# Patient Record
Sex: Male | Born: 1965 | Race: White | Hispanic: No | Marital: Single | Smoking: Never smoker
Health system: Southern US, Community
[De-identification: ages and names within clinical notes are randomized; demographics above are authoritative.]

## PROBLEM LIST (undated history)

## (undated) DIAGNOSIS — R1013 Epigastric pain: Secondary | ICD-10-CM

## (undated) DIAGNOSIS — R0789 Other chest pain: Secondary | ICD-10-CM

## (undated) DIAGNOSIS — M609 Myositis, unspecified: Secondary | ICD-10-CM

## (undated) DIAGNOSIS — N451 Epididymitis: Secondary | ICD-10-CM

## (undated) DIAGNOSIS — M791 Myalgia, unspecified site: Secondary | ICD-10-CM

## (undated) HISTORY — DX: Other chest pain: R07.89

## (undated) HISTORY — DX: Myositis, unspecified: M60.9

## (undated) HISTORY — DX: Myalgia, unspecified site: M79.10

## (undated) HISTORY — DX: Epigastric pain: R10.13

## (undated) HISTORY — PX: LASIK: SHX215

## (undated) HISTORY — DX: Epididymitis: N45.1

## (undated) HISTORY — PX: OTHER SURGICAL HISTORY: SHX169

---

## 2000-03-08 ENCOUNTER — Encounter: Admission: RE | Admit: 2000-03-08 | Discharge: 2000-03-08 | Payer: Self-pay | Admitting: Family Medicine

## 2000-03-08 ENCOUNTER — Encounter: Payer: Self-pay | Admitting: Family Medicine

## 2004-07-27 ENCOUNTER — Encounter: Admission: RE | Admit: 2004-07-27 | Discharge: 2004-07-27 | Payer: Self-pay | Admitting: Family Medicine

## 2004-11-03 ENCOUNTER — Encounter: Admission: RE | Admit: 2004-11-03 | Discharge: 2004-11-03 | Payer: Self-pay | Admitting: Family Medicine

## 2006-07-06 ENCOUNTER — Encounter: Admission: RE | Admit: 2006-07-06 | Discharge: 2006-07-06 | Payer: Self-pay | Admitting: *Deleted

## 2011-11-09 ENCOUNTER — Other Ambulatory Visit: Payer: Self-pay | Admitting: Family Medicine

## 2011-11-09 DIAGNOSIS — R2230 Localized swelling, mass and lump, unspecified upper limb: Secondary | ICD-10-CM

## 2011-11-09 DIAGNOSIS — R0789 Other chest pain: Secondary | ICD-10-CM

## 2011-11-14 ENCOUNTER — Ambulatory Visit
Admission: RE | Admit: 2011-11-14 | Discharge: 2011-11-14 | Disposition: A | Discharge status: Home / Self Care | Payer: 59 | Source: Ambulatory Visit | Attending: Family Medicine | Admitting: Family Medicine

## 2011-11-14 ENCOUNTER — Other Ambulatory Visit: Payer: Self-pay | Admitting: Family Medicine

## 2011-11-14 DIAGNOSIS — R0789 Other chest pain: Secondary | ICD-10-CM

## 2011-11-14 DIAGNOSIS — R2230 Localized swelling, mass and lump, unspecified upper limb: Secondary | ICD-10-CM

## 2012-08-21 HISTORY — PX: SHOULDER SURGERY: SHX246

## 2013-06-30 ENCOUNTER — Other Ambulatory Visit: Payer: Self-pay | Admitting: Orthopedic Surgery

## 2013-06-30 DIAGNOSIS — M25511 Pain in right shoulder: Secondary | ICD-10-CM

## 2013-07-10 ENCOUNTER — Ambulatory Visit
Admission: RE | Admit: 2013-07-10 | Discharge: 2013-07-10 | Disposition: A | Discharge status: Home / Self Care | Payer: 59 | Source: Ambulatory Visit | Attending: Orthopedic Surgery | Admitting: Orthopedic Surgery

## 2013-07-10 DIAGNOSIS — M25511 Pain in right shoulder: Secondary | ICD-10-CM

## 2013-07-10 MED ORDER — IOHEXOL 180 MG/ML  SOLN
15.0000 mL | Freq: Once | INTRAMUSCULAR | Status: AC | PRN
  Administered 2013-07-10: 15 mL via INTRA_ARTICULAR

## 2015-10-12 NOTE — Progress Notes (Signed)
Patient ID: Jesus Anderson, male   DOB: 13-Aug-1966, 50 y.o.   MRN: [id number]     Cardiology Office Note   Date:  10/18/2015   ID:  Jesus Anderson, DOB September 16, 1965, MRN [id number]  PCP:  Jerral Ralph, MD  Cardiologist:   Charlton Haws, MD   Chief Complaint  Patient presents with  . Establish Care    tightness & pressure in chest, been 6/7 weeks now.      History of Present Illness: Jesus Anderson is a 50 y.o. male who presents for evaluation of chest pain  Seen by [facility] 09/13/15  Records reviewed.  Pain is atypical.  Central chest Sharp and dull.  Not related to exertion. Family history of premature CAD on mothers side.  Pain is persistent the last 6 weeks.  Has not really tried prilosec for it Worried as he has 3 young children and does not want to have a heart attack    Past Medical History  Diagnosis Date  . Epididymitis   . Myalgia   . Myositis   . Dyspepsia   . Chest heaviness     History reviewed. No pertinent past surgical history.   No current outpatient prescriptions on file.   No current facility-administered medications for this visit.    Allergies:   Review of patient's allergies indicates no known allergies.    Social History:  The patient  reports that he has never smoked. He does not have any smokeless tobacco history on file.   Family History:  The patient's family history includes Coronary artery disease (age of onset: 73) in his maternal grandfather; Healthy in his daughter, daughter, father, sister, and son; Hypertension in his mother and sister.    ROS:  Please see the history of present illness.   Otherwise, review of systems are positive for none.   All other systems are reviewed and negative.    PHYSICAL EXAM: VS:  BP 140/32 mmHg  Pulse 83  Ht  (1.702 m)  Wt 80.196 kg (176 lb 12.8 oz)  BMI 27.68 kg/m2 , BMI Body mass index is 27.68 kg/(m^2). Affect appropriate Healthy:  appears stated age HEENT: normal Neck supple  with no adenopathy JVP normal no bruits no thyromegaly Lungs clear with no wheezing and good diaphragmatic motion Heart:  S1/S2 no murmur, no rub, gallop or click PMI normal Abdomen: benighn, BS positve, no tenderness, no AAA no bruit.  No HSM or HJR Distal pulses intact with no bruits No edema Neuro non-focal Skin warm and dry No muscular weakness    EKG:  09/13/15 SR rate 80 normal   10/18/15  NSR rate 83  Normal    Recent Labs: No results found for requested labs within last 365 days.    Lipid Panel No results found for: CHOL, TRIG, HDL, CHOLHDL, VLDL, LDLCALC, LDLDIRECT    Wt Readings from Last 3 Encounters:  10/18/15 80.196 kg (176 lb 12.8 oz)      Other studies Reviewed: Additional studies/ records that were reviewed today include: Primary notes/ ECG from [facility] see HPI.    ASSESSMENT AND PLAN:  1.  Chest Pain:  Atypical normal ECG x 2  F/u ETT Also f/u coronary calcium score for 5 year risk assessment   Current medicines are reviewed at length with the patient today.  The patient does not have concerns regarding medicines.  The following changes have been made:  no change  Labs/ tests ordered today include: ETT Calcium  Score   No orders of the defined types were placed in this encounter.     Disposition:   FU with PRN     Signed, Charlton Haws, MD  10/18/2015 4:26 PM    [facility] [address] [place], [place], [place]  [address] Phone: [phone]; Fax: [phone]

## 2015-10-14 DIAGNOSIS — N451 Epididymitis: Secondary | ICD-10-CM | POA: Insufficient documentation

## 2015-10-14 DIAGNOSIS — M609 Myositis, unspecified: Secondary | ICD-10-CM | POA: Insufficient documentation

## 2015-10-14 DIAGNOSIS — M791 Myalgia, unspecified site: Secondary | ICD-10-CM | POA: Insufficient documentation

## 2015-10-14 DIAGNOSIS — R1013 Epigastric pain: Secondary | ICD-10-CM | POA: Insufficient documentation

## 2015-10-18 ENCOUNTER — Encounter: Payer: Self-pay | Admitting: Cardiovascular Disease

## 2015-10-18 ENCOUNTER — Ambulatory Visit (INDEPENDENT_AMBULATORY_CARE_PROVIDER_SITE_OTHER): Payer: 59 | Admitting: Cardiovascular Disease

## 2015-10-18 VITALS — BP 140/32 | HR 83 | Ht 67.0 in | Wt 176.8 lb

## 2015-10-18 DIAGNOSIS — Z7189 Other specified counseling: Secondary | ICD-10-CM

## 2015-10-18 DIAGNOSIS — Z7689 Persons encountering health services in other specified circumstances: Secondary | ICD-10-CM

## 2015-10-18 DIAGNOSIS — R0789 Other chest pain: Secondary | ICD-10-CM | POA: Diagnosis not present

## 2015-10-18 NOTE — Patient Instructions (Addendum)
Medication Instructions:  Your physician recommends that you continue on your current medications as directed. Please refer to the Current Medication list given to you today.  Labwork: NONE  Testing/Procedures: Your physician has requested that you have an exercise tolerance test. For further information please visit [email or web address]. Please also follow instruction sheet, as given.  Cardiac CT calcium score scanning, (CAT scanning), is a noninvasive, special x-ray that produces cross-sectional images of the body using x-rays and a computer. CT scans help physicians diagnose and treat medical conditions. For some CT exams, a contrast material is used to enhance visibility in the area of the body being studied. CT scans provide greater clarity and reveal more details than regular x-ray exams.  Follow-Up: Your physician wants you to follow-up as needed.  If you need a refill on your cardiac medications before your next appointment, please call your pharmacy.   

## 2015-10-29 ENCOUNTER — Ambulatory Visit (INDEPENDENT_AMBULATORY_CARE_PROVIDER_SITE_OTHER)
Admission: RE | Admit: 2015-10-29 | Discharge: 2015-10-29 | Disposition: A | Discharge status: Home / Self Care | Payer: Self-pay | Source: Ambulatory Visit | Attending: Cardiovascular Disease | Admitting: Cardiovascular Disease

## 2015-10-29 ENCOUNTER — Ambulatory Visit (INDEPENDENT_AMBULATORY_CARE_PROVIDER_SITE_OTHER): Payer: 59

## 2015-10-29 DIAGNOSIS — Z7189 Other specified counseling: Secondary | ICD-10-CM

## 2015-10-29 DIAGNOSIS — R0789 Other chest pain: Secondary | ICD-10-CM

## 2015-10-29 DIAGNOSIS — Z7689 Persons encountering health services in other specified circumstances: Secondary | ICD-10-CM

## 2015-10-29 LAB — EXERCISE TOLERANCE TEST
CHL CUP STRESS STAGE 1 DBP: 95 mmHg
CHL CUP STRESS STAGE 1 GRADE: 0 %
CHL CUP STRESS STAGE 10 DBP: 70 mmHg
CHL CUP STRESS STAGE 10 HR: 113 {beats}/min
CHL CUP STRESS STAGE 10 SBP: 156 mmHg
CHL CUP STRESS STAGE 2 SPEED: 1 mph
CHL CUP STRESS STAGE 3 GRADE: 0 %
CHL CUP STRESS STAGE 3 SPEED: 1 mph
CHL CUP STRESS STAGE 4 GRADE: 10 %
CHL CUP STRESS STAGE 4 HR: 115 {beats}/min
CHL CUP STRESS STAGE 4 SBP: 165 mmHg
CHL CUP STRESS STAGE 5 DBP: 81 mmHg
CHL CUP STRESS STAGE 5 GRADE: 12 %
CHL CUP STRESS STAGE 5 SBP: 180 mmHg
CHL CUP STRESS STAGE 5 SPEED: 2.5 mph
CHL CUP STRESS STAGE 6 HR: 141 {beats}/min
CHL CUP STRESS STAGE 6 SBP: 172 mmHg
CHL CUP STRESS STAGE 7 DBP: 78 mmHg
CHL CUP STRESS STAGE 8 HR: 171 {beats}/min
CHL CUP STRESS STAGE 8 SPEED: 4.2 mph
CHL CUP STRESS STAGE 9 GRADE: 0 %
CHL CUP STRESS STAGE 9 HR: 153 {beats}/min
CHL CUP STRESS STAGE 9 SBP: 161 mmHg
CHL CUP STRESS STAGE 9 SPEED: 1.5 mph
CHL RATE OF PERCEIVED EXERTION: 15
CSEPED: 12 min
CSEPEDS: 0 s
CSEPHR: 102 %
Estimated workload: 13.4 METS
MPHR: 168 {beats}/min
Peak HR: 171 {beats}/min
Percent of predicted max HR: 101 %
Rest HR: 85 {beats}/min
Stage 1 HR: 91 {beats}/min
Stage 1 SBP: 141 mmHg
Stage 1 Speed: 0 mph
Stage 10 Grade: 0 %
Stage 10 Speed: 0 mph
Stage 2 Grade: 0 %
Stage 2 HR: 93 {beats}/min
Stage 3 HR: 93 {beats}/min
Stage 4 DBP: 79 mmHg
Stage 4 Speed: 1.7 mph
Stage 5 HR: 126 {beats}/min
Stage 6 DBP: 78 mmHg
Stage 6 Grade: 14 %
Stage 6 Speed: 3.4 mph
Stage 7 Grade: 16 %
Stage 7 HR: 171 {beats}/min
Stage 7 SBP: 163 mmHg
Stage 7 Speed: 4.2 mph
Stage 8 Grade: 16 %
Stage 9 DBP: 57 mmHg

## 2015-11-01 ENCOUNTER — Telehealth: Payer: Self-pay

## 2015-11-01 DIAGNOSIS — R9439 Abnormal result of other cardiovascular function study: Secondary | ICD-10-CM

## 2015-11-01 DIAGNOSIS — Z01812 Encounter for preprocedural laboratory examination: Secondary | ICD-10-CM

## 2015-11-01 NOTE — Telephone Encounter (Signed)
-----   Message from Wendall StadePeter C Nishan, MD sent at 10/30/2015 11:21 PM EST ----- Positvie ETT but calcium score is 0  Needs f/u cardiac CTA schedule for me to do

## 2015-11-01 NOTE — Telephone Encounter (Signed)
Patient is aware of test results. [facility] and [facility].

## 2015-11-02 ENCOUNTER — Encounter: Payer: Self-pay | Admitting: Cardiovascular Disease

## 2015-11-15 ENCOUNTER — Encounter (HOSPITAL_COMMUNITY): Payer: Self-pay

## 2015-11-15 ENCOUNTER — Ambulatory Visit (HOSPITAL_COMMUNITY)
Admission: RE | Admit: 2015-11-15 | Discharge: 2015-11-15 | Disposition: A | Discharge status: Home / Self Care | Payer: 59 | Source: Ambulatory Visit | Attending: Cardiovascular Disease | Admitting: Cardiovascular Disease

## 2015-11-15 DIAGNOSIS — R9439 Abnormal result of other cardiovascular function study: Secondary | ICD-10-CM | POA: Diagnosis not present

## 2015-11-15 MED ORDER — NITROGLYCERIN 0.4 MG SL SUBL
0.4000 mg | SUBLINGUAL_TABLET | SUBLINGUAL | Status: DC | PRN
  Administered 2015-11-15: 0.8 mg via SUBLINGUAL

## 2015-11-15 MED ORDER — IOPAMIDOL (ISOVUE-370) INJECTION 76%
INTRAVENOUS | Status: AC
  Administered 2015-11-15: 80 mL
  Filled 2015-11-15: qty 100

## 2015-11-15 MED ORDER — METOPROLOL TARTRATE 1 MG/ML IV SOLN
INTRAVENOUS | Status: AC
  Filled 2015-11-15: qty 15

## 2015-11-15 MED ORDER — METOPROLOL TARTRATE 1 MG/ML IV SOLN
5.0000 mg | INTRAVENOUS | Status: DC | PRN
  Administered 2015-11-15 (×3): 5 mg via INTRAVENOUS

## 2015-11-15 MED ORDER — NITROGLYCERIN 0.4 MG SL SUBL
SUBLINGUAL_TABLET | SUBLINGUAL | Status: AC
  Filled 2015-11-15: qty 2

## 2016-12-29 DIAGNOSIS — J309 Allergic rhinitis, unspecified: Secondary | ICD-10-CM | POA: Diagnosis not present

## 2016-12-29 DIAGNOSIS — J029 Acute pharyngitis, unspecified: Secondary | ICD-10-CM | POA: Diagnosis not present

## 2017-01-07 IMAGING — CT CT HEART SCORING
2 series · 16 of 20 positions shown, 18 images · non-contrast
Comparison: None.

CLINICAL DATA: Risk stratification

EXAM:
Coronary Calcium Score
TECHNIQUE: The patient was scanned on a Siemens Sensation 16 slice scanner.
Axial non-contrast 3mm slices were carried out through the heart.
The data set was analyzed on a dedicated work station and scored
using the Agatson method.

[Series 2: casc 3.0 i36f 2 bestdiast 66 % · axial · 0.39mm/px · z∈[-249,-150]mm · 8 of 43 slices shown, 10 images]
[im 5/43  vessel]
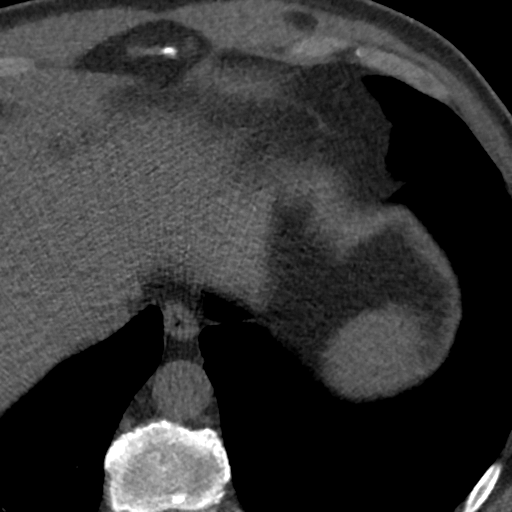
[im 5/43  lung]
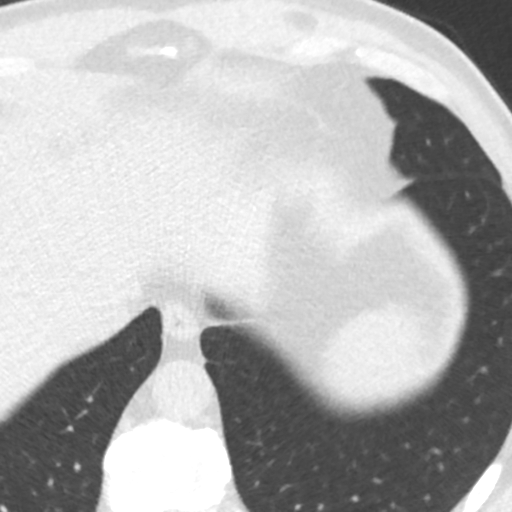
[im 10/43  vessel]
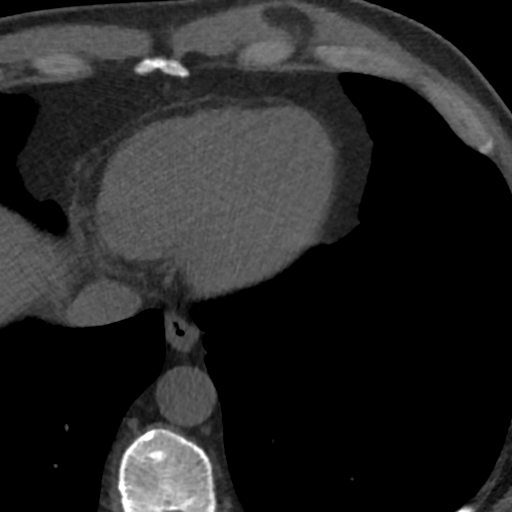
[im 15/43  vessel]
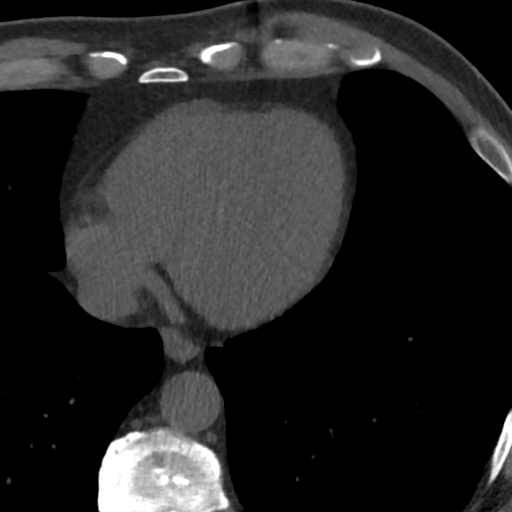
[im 19/43  vessel]
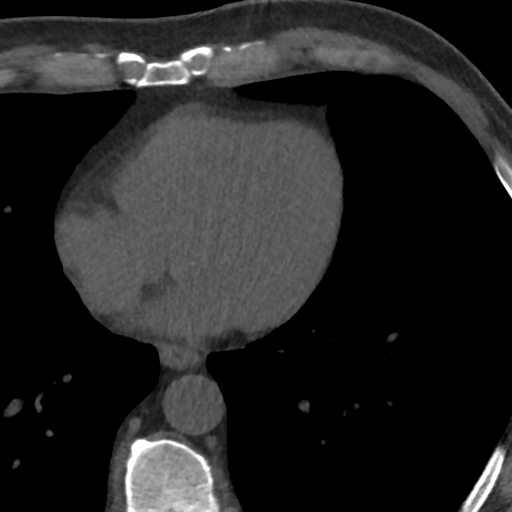
[im 24/43  vessel]
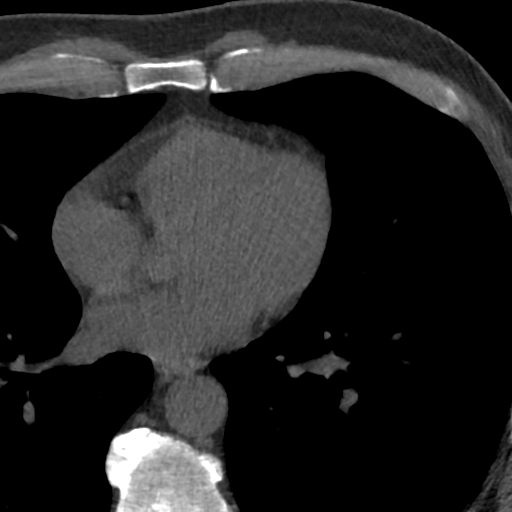
[im 24/43  lung]
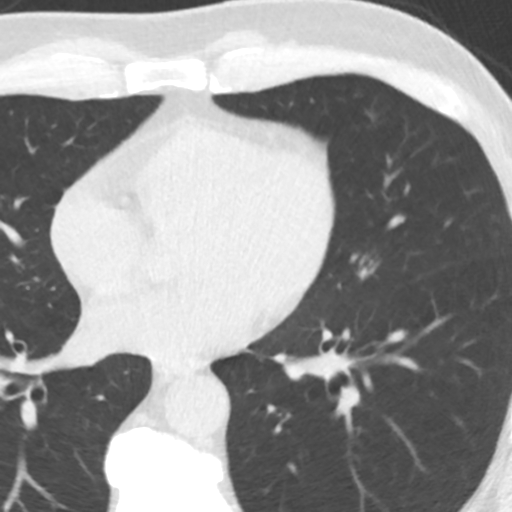
[im 29/43  vessel]
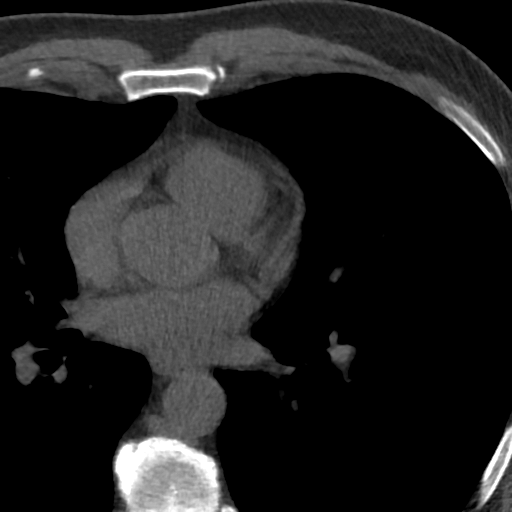
[im 33/43  vessel]
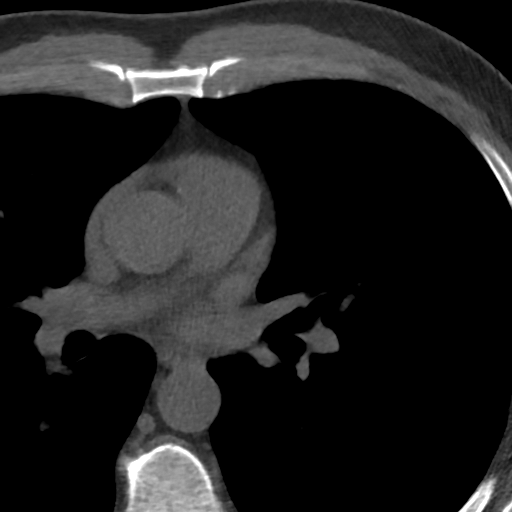
[im 38/43  vessel]
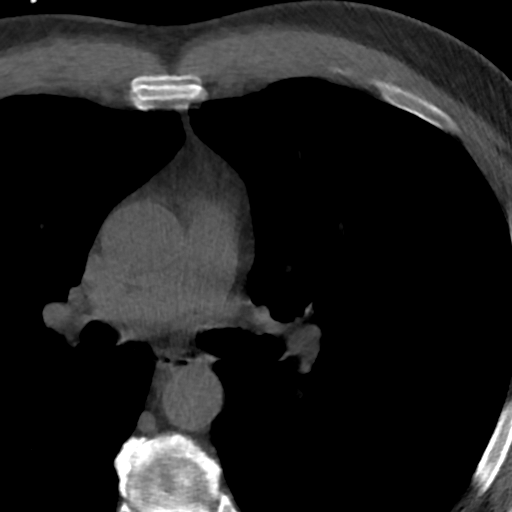

[Series 4: lung st 68 % · axial · 0.68mm/px · z∈[-252,-150]mm · 8 of 44 slices shown]
[im 5/44  lung]
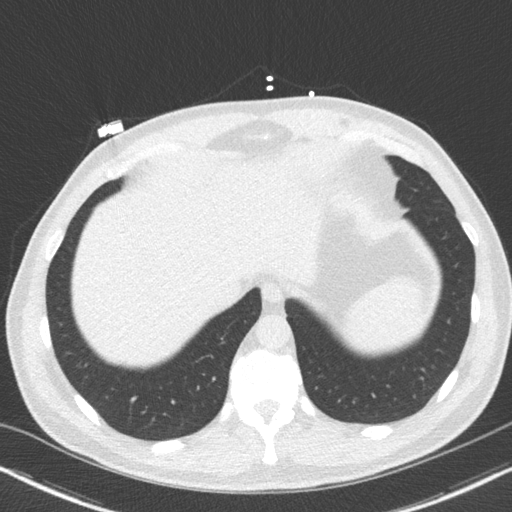
[im 10/44  lung]
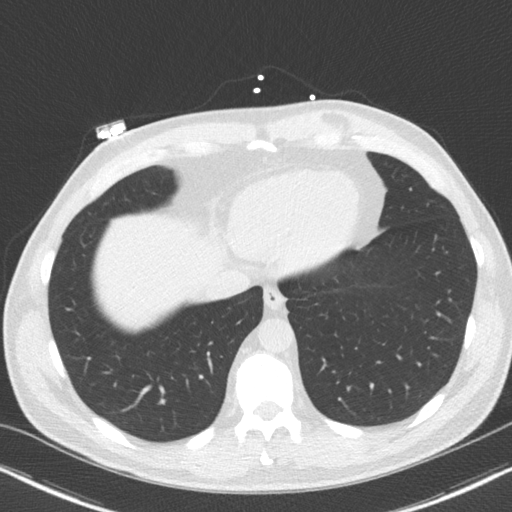
[im 15/44  lung]
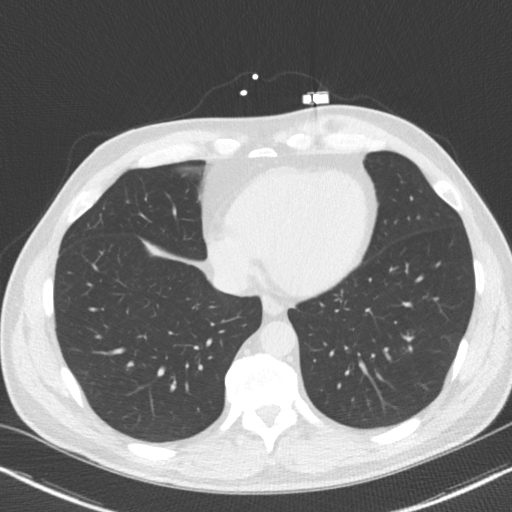
[im 20/44  lung]
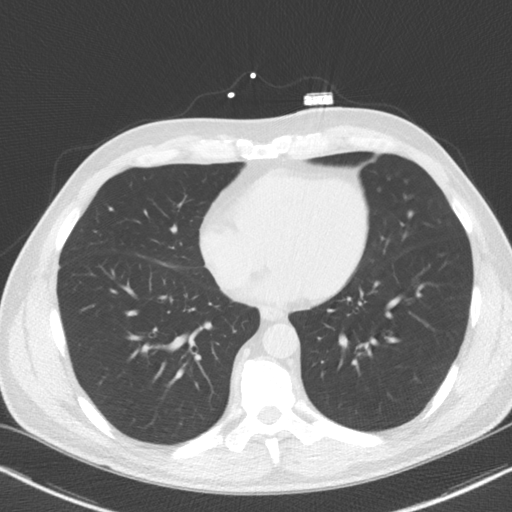
[im 24/44  lung]
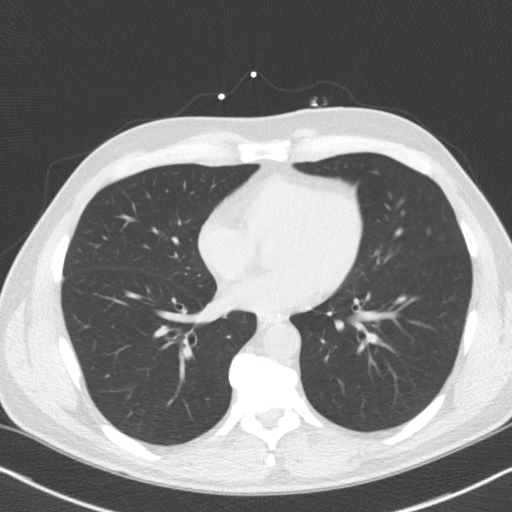
[im 29/44  lung]
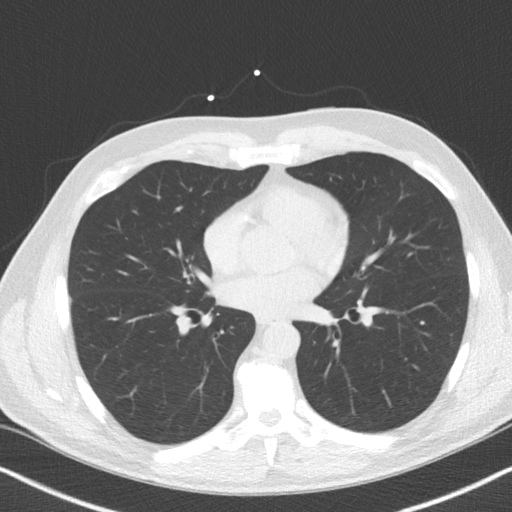
[im 34/44  lung]
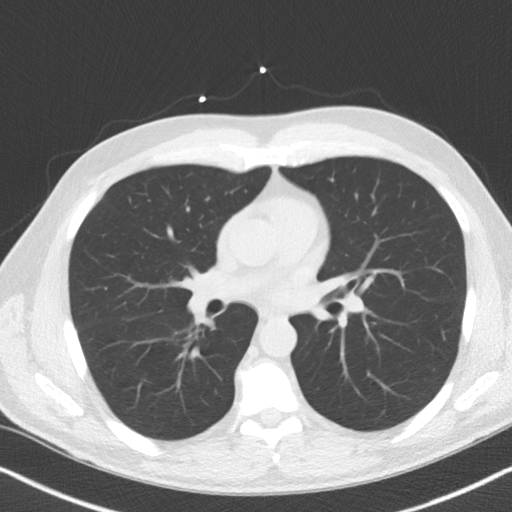
[im 39/44  lung]
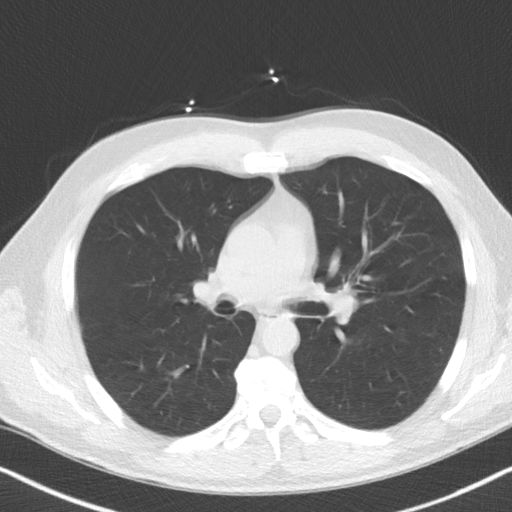

[16 of 20 positions shown; findings below may reference images not displayed]

FINDINGS: Non-cardiac: No significant non cardiac findings on limited lung and
soft tissue windows. See separate report from [REDACTED].

Ascending Aorta:  3.3 cm

Pericardium: Normal

Coronary arteries:  No calcium detected
IMPRESSION: Coronary calcium score of 0.

[facility]

EXAM:
OVER-READ INTERPRETATION  CT CHEST

The following report is an over-read performed by radiologist Dr.
does not include interpretation of cardiac or coronary anatomy or
pathology. The coronary calcium score/coronary CTA interpretation by
the cardiologist is attached.
FINDINGS: The visualized portions of the lungs are clear. The visualized
portions of the mediastinum and chest wall are unremarkable.
IMPRESSION: No significant non-cardiac abnormality seen in visualized portion of
the thorax.

## 2017-05-25 DIAGNOSIS — M791 Myalgia, unspecified site: Secondary | ICD-10-CM | POA: Diagnosis not present

## 2017-06-20 DIAGNOSIS — M542 Cervicalgia: Secondary | ICD-10-CM | POA: Diagnosis not present

## 2017-06-20 DIAGNOSIS — S93529A Sprain of metatarsophalangeal joint of unspecified toe(s), initial encounter: Secondary | ICD-10-CM | POA: Diagnosis not present

## 2017-09-27 DIAGNOSIS — K219 Gastro-esophageal reflux disease without esophagitis: Secondary | ICD-10-CM | POA: Diagnosis not present

## 2018-05-02 DIAGNOSIS — R202 Paresthesia of skin: Secondary | ICD-10-CM | POA: Diagnosis not present

## 2018-05-10 ENCOUNTER — Encounter: Payer: Self-pay | Admitting: Diagnostic Neuroimaging

## 2018-05-10 ENCOUNTER — Ambulatory Visit (INDEPENDENT_AMBULATORY_CARE_PROVIDER_SITE_OTHER): Payer: 59 | Admitting: Diagnostic Neuroimaging

## 2018-05-10 VITALS — BP 150/95 | HR 67 | Ht 67.0 in | Wt 178.0 lb

## 2018-05-10 DIAGNOSIS — R200 Anesthesia of skin: Secondary | ICD-10-CM | POA: Diagnosis not present

## 2018-05-10 NOTE — Patient Instructions (Signed)
-   monitor symptoms (over next 4-6 weeks) - consider MRI cervical spine - consider EMG/NCS

## 2018-05-10 NOTE — Progress Notes (Signed)
[facility]  PATIENT: Jesus Anderson DOB: 14-Apr-1966  REFERRING CLINICIAN: Darlin Drop HISTORY FROM: patient  REASON FOR VISIT: new consult    HISTORICAL  CHIEF COMPLAINT:  Chief Complaint  Patient presents with  . New Patient (Initial Visit)    Rm 6, alone  . numbness/ tingling in fingers, toes    Dr. Joycelyn Rua    HISTORY OF PRESENT ILLNESS:   52 year old male here for evaluation of numbness tingling.  March 2019 patient had electrical shock injury while installing a washer and dryer, where he felt shocked in his left hand and then radiate throughout his body.  He had some soreness and pain in his left thumb for several days/weeks afterwards.  Symptoms resolved.  July 2019 patient was cutting a tree, standing on ladder, when the tree limb suddenly fell.  He jumped off the ladder and landed on the ground on his feet, but felt a jolt throughout his body.  He is carrying a chainsaw at the time.  Around that time and soon after he noticed some mild numbness and tingling sensation in his fingertips.  Numbness is mainly noticed in the middle day, while at work at a desk.  If he gets up and walks around symptoms seem to resolve.  Patient has history of neck pain, disc herniation in 2001, monitored over time with MRI in 2006 with resolution spontaneously.  No surgery was needed.  Patient continues to stay active.  No major problems with his legs.  No weakness.  No balance issues.  No bowel or bladder dysfunction.  No numbness in his face or mouth.  He had a different issue more recently with unexplained swelling in his left and then right eye which has spontaneously resolved.   REVIEW OF SYSTEMS: Full 14 system review of systems performed and negative with exception of: Numbness itching.  ALLERGIES: No Known Allergies  HOME MEDICATIONS: Outpatient Medications Prior to Visit  Medication Sig Dispense Refill  . ibuprofen (ADVIL,MOTRIN) 200 MG tablet Take 200  mg by mouth every 6 (six) hours as needed.     No facility-administered medications prior to visit.     PAST MEDICAL HISTORY: Past Medical History:  Diagnosis Date  . Chest heaviness   . Dyspepsia   . Epididymitis   . Myalgia   . Myositis     PAST SURGICAL HISTORY: Past Surgical History:  Procedure Laterality Date  . eyelid surgery Left    Dr. Luster Landsberg 2006  . LASIK  2000's  . SHOULDER SURGERY  2014   murphy wainer    FAMILY HISTORY: Family History  Problem Relation Age of Onset  . Hypertension Mother   . Cancer Father   . Coronary artery disease Maternal Grandfather 70  . Healthy Sister   . Hypertension Sister   . Healthy Son   . Healthy Daughter   . Healthy Daughter     SOCIAL HISTORY: Social History   Socioeconomic History  . Marital status: Single    Spouse name: Not on file  . Number of children: Not on file  . Years of education: Not on file  . Highest education level: Not on file  Occupational History  . Not on file  Social Needs  . Financial resource strain: Not on file  . Food insecurity:    Worry: Not on file    Inability: Not on file  . Transportation needs:    Medical: Not on file    Non-medical: Not on file  Tobacco Use  . Smoking status: Never Smoker  . Smokeless tobacco: Never Used  Substance and Sexual Activity  . Alcohol use: Yes    Alcohol/week: 3.0 - 4.0 standard drinks    Types: 3 - 4 Glasses of wine per week    Comment: weekly  . Drug use: Never  . Sexual activity: Not on file  Lifestyle  . Physical activity:    Days per week: Not on file    Minutes per session: Not on file  . Stress: Not on file  Relationships  . Social connections:    Talks on phone: Not on file    Gets together: Not on file    Attends religious service: Not on file    Active member of club or organization: Not on file    Attends meetings of clubs or organizations: Not on file    Relationship status: Not on file  . Intimate partner violence:    Fear  of current or ex partner: Not on file    Emotionally abused: Not on file    Physically abused: Not on file    Forced sexual activity: Not on file  Other Topics Concern  . Not on file  Social History Narrative   Lives home with wife and 3 kids.  [employer].  [employer]. Caffeine 2 cups daily.       PHYSICAL EXAM  GENERAL EXAM/CONSTITUTIONAL: Vitals:  Vitals:   05/10/18 1140  BP: (!) 150/95  Pulse: 67  Weight: 178 lb (80.7 kg)  Height: 5\' 7"  (1.702 m)     Body mass index is 27.88 kg/m. Wt Readings from Last 3 Encounters:  05/10/18 178 lb (80.7 kg)  10/18/15 176 lb 12.8 oz (80.2 kg)     Patient is in no distress; well developed, nourished and groomed; neck is supple  CARDIOVASCULAR:  Examination of carotid arteries is normal; no carotid bruits  Regular rate and rhythm, no murmurs  Examination of peripheral vascular system by observation and palpation is normal  EYES:  Ophthalmoscopic exam of optic discs and posterior segments is normal; no papilledema or hemorrhages  Visual Acuity Screening   Right eye Left eye Both eyes  Without correction: 20/50 20/50   With correction:        MUSCULOSKELETAL:  Gait, strength, tone, movements noted in Neurologic exam below  NEUROLOGIC: MENTAL STATUS:  No flowsheet data found.  awake, alert, oriented to person, place and time  recent and remote memory intact  normal attention and concentration  language fluent, comprehension intact, naming intact  fund of knowledge appropriate  CRANIAL NERVE:   2nd - no papilledema on fundoscopic exam  2nd, 3rd, 4th, 6th - pupils equal and reactive to light, visual fields full to confrontation, extraocular muscles intact, no nystagmus  5th - facial sensation symmetric  7th - facial strength symmetric  8th - hearing intact  9th - palate elevates symmetrically, uvula midline  11th - shoulder shrug symmetric  12th - tongue protrusion midline  MOTOR:    normal bulk and tone, full strength in the BUE, BLE  SENSORY:   normal and symmetric to light touch, pinprick, temperature, vibration  COORDINATION:   finger-nose-finger, fine finger movements normal  REFLEXES:   deep tendon reflexes present and symmetric  GAIT/STATION:   narrow based gait     DIAGNOSTIC DATA (LABS, IMAGING, TESTING) - I reviewed patient records, labs, notes, testing and imaging myself where available.  No results found for: WBC, HGB, HCT, MCV, PLT  No results found for: NA, K, CL, CO2, GLUCOSE, BUN, CREATININE, CALCIUM, PROT, ALBUMIN, AST, ALT, ALKPHOS, BILITOT, GFRNONAA, GFRAA No results found for: CHOL, HDL, LDLCALC, LDLDIRECT, TRIG, CHOLHDL No results found for: ZOXW9U No results found for: VITAMINB12 No results found for: TSH  11/03/04 MRI cervical [I reviewed images myself and agree with interpretation. -[facility]]  - Mild degenerative changes. These is left-sided spurring at C5-6 without deformity of the cord. Disc protrusion on the left at C5-6 seen in 2001 has resolved and incorporated into osteophyte.      ASSESSMENT AND PLAN  52 y.o. year old male here with intermittent and fluctuating subjective numbness and tingling in bilateral hands, fingertips, since July 2019.  Neurologic examination is unremarkable.  Prodromal factors include electrical shock injury in March 2019 as well as jumping off of a ladder suddenly in July 2019.  Unclear whether these are related to current symptoms.  Due to mild nature of symptoms and unremarkable neurologic examination, I suggested patient observe and monitor the symptoms over the next 4 to 6 weeks.  If symptoms are stable and start to improve then I would advise further monitoring.  If symptoms fail to improve and we could consider MRI of the cervical spine and EMG nerve conduction study testing.  Dx:  1. Numbness     PLAN:  - monitor symptoms (over next 4-6 weeks) - may consider MRI cervical spine - may  consider EMG/NCS  Return if symptoms worsen or fail to improve.    Suanne Marker, MD 05/10/2018, 12:35 PM Certified in Neurology, Neurophysiology and Neuroimaging  [facility] [address] [place], [place] [address] [phone]

## 2018-08-06 DIAGNOSIS — H1045 Other chronic allergic conjunctivitis: Secondary | ICD-10-CM | POA: Diagnosis not present

## 2018-08-06 DIAGNOSIS — J3089 Other allergic rhinitis: Secondary | ICD-10-CM | POA: Diagnosis not present

## 2018-08-06 DIAGNOSIS — T783XXD Angioneurotic edema, subsequent encounter: Secondary | ICD-10-CM | POA: Diagnosis not present
